# Patient Record
Sex: Female | Born: 1965 | Race: Black or African American | Hispanic: No | Marital: Married | Smoking: Never smoker
Health system: Southern US, Community
[De-identification: ages and names within clinical notes are randomized; demographics above are authoritative.]

## PROBLEM LIST (undated history)

## (undated) DIAGNOSIS — I10 Essential (primary) hypertension: Secondary | ICD-10-CM

## (undated) HISTORY — PX: HERNIA REPAIR: SHX51

## (undated) HISTORY — PX: ABDOMINAL HYSTERECTOMY: SHX81

---

## 2021-02-12 ENCOUNTER — Emergency Department (HOSPITAL_BASED_OUTPATIENT_CLINIC_OR_DEPARTMENT_OTHER): Payer: PRIVATE HEALTH INSURANCE

## 2021-02-12 ENCOUNTER — Encounter (HOSPITAL_BASED_OUTPATIENT_CLINIC_OR_DEPARTMENT_OTHER): Payer: Self-pay | Admitting: Emergency Medicine

## 2021-02-12 ENCOUNTER — Emergency Department (HOSPITAL_BASED_OUTPATIENT_CLINIC_OR_DEPARTMENT_OTHER)
Admission: EM | Admit: 2021-02-12 | Discharge: 2021-02-13 | Disposition: A | Discharge status: Home / Self Care | Payer: PRIVATE HEALTH INSURANCE | Attending: Emergency Medicine | Admitting: Emergency Medicine

## 2021-02-12 ENCOUNTER — Other Ambulatory Visit: Payer: Self-pay

## 2021-02-12 DIAGNOSIS — Z79899 Other long term (current) drug therapy: Secondary | ICD-10-CM | POA: Insufficient documentation

## 2021-02-12 DIAGNOSIS — I10 Essential (primary) hypertension: Secondary | ICD-10-CM | POA: Insufficient documentation

## 2021-02-12 DIAGNOSIS — R55 Syncope and collapse: Secondary | ICD-10-CM | POA: Insufficient documentation

## 2021-02-12 DIAGNOSIS — E041 Nontoxic single thyroid nodule: Secondary | ICD-10-CM

## 2021-02-12 DIAGNOSIS — I2699 Other pulmonary embolism without acute cor pulmonale: Secondary | ICD-10-CM | POA: Insufficient documentation

## 2021-02-12 DIAGNOSIS — E876 Hypokalemia: Secondary | ICD-10-CM

## 2021-02-12 HISTORY — DX: Essential (primary) hypertension: I10

## 2021-02-12 LAB — CBC WITH DIFFERENTIAL/PLATELET
Abs Immature Granulocytes: 0.03 10*3/uL (ref 0.00–0.07)
Basophils Absolute: 0 10*3/uL (ref 0.0–0.1)
Basophils Relative: 0 %
Eosinophils Absolute: 0.2 10*3/uL (ref 0.0–0.5)
Eosinophils Relative: 2 %
HCT: 36.4 % (ref 36.0–46.0)
Hemoglobin: 11.8 g/dL — ABNORMAL LOW (ref 12.0–15.0)
Immature Granulocytes: 0 %
Lymphocytes Relative: 30 %
Lymphs Abs: 2.7 10*3/uL (ref 0.7–4.0)
MCH: 26.2 pg (ref 26.0–34.0)
MCHC: 32.4 g/dL (ref 30.0–36.0)
MCV: 80.9 fL (ref 80.0–100.0)
Monocytes Absolute: 0.7 10*3/uL (ref 0.1–1.0)
Monocytes Relative: 8 %
Neutro Abs: 5.5 10*3/uL (ref 1.7–7.7)
Neutrophils Relative %: 60 %
Platelets: 340 10*3/uL (ref 150–400)
RBC: 4.5 MIL/uL (ref 3.87–5.11)
RDW: 12.7 % (ref 11.5–15.5)
WBC: 9.1 10*3/uL (ref 4.0–10.5)
nRBC: 0 % (ref 0.0–0.2)

## 2021-02-12 LAB — COMPREHENSIVE METABOLIC PANEL
ALT: 8 U/L (ref 0–44)
AST: 15 U/L (ref 15–41)
Albumin: 4.3 g/dL (ref 3.5–5.0)
Alkaline Phosphatase: 80 U/L (ref 38–126)
Anion gap: 12 (ref 5–15)
BUN: 13 mg/dL (ref 6–20)
CO2: 29 mmol/L (ref 22–32)
Calcium: 9.7 mg/dL (ref 8.9–10.3)
Chloride: 99 mmol/L (ref 98–111)
Creatinine, Ser: 0.9 mg/dL (ref 0.44–1.00)
GFR, Estimated: >60 mL/min (ref >60)
Glucose, Bld: 103 mg/dL — ABNORMAL HIGH (ref 70–99)
Potassium: 2.9 mmol/L — ABNORMAL LOW (ref 3.5–5.1)
Sodium: 140 mmol/L (ref 135–145)
Total Bilirubin: 0.3 mg/dL (ref 0.3–1.2)
Total Protein: 8 g/dL (ref 6.5–8.1)

## 2021-02-12 LAB — CBG MONITORING, ED: Glucose-Capillary: 97 mg/dL (ref 70–99)

## 2021-02-12 LAB — TROPONIN I (HIGH SENSITIVITY): Troponin I (High Sensitivity): 6 ng/L (ref <18)

## 2021-02-12 LAB — MAGNESIUM: Magnesium: 2.2 mg/dL (ref 1.7–2.4)

## 2021-02-12 MED ORDER — SODIUM CHLORIDE 0.9 % IV SOLN: INTRAVENOUS | Status: DC

## 2021-02-12 MED ORDER — SODIUM CHLORIDE 0.9 % IV BOLUS
1000.0000 mL | Freq: Once | INTRAVENOUS | Status: AC
  Administered 2021-02-12: 1000 mL via INTRAVENOUS

## 2021-02-12 MED ORDER — POTASSIUM CHLORIDE CRYS ER 20 MEQ PO TBCR
40.0000 meq | EXTENDED_RELEASE_TABLET | Freq: Once | ORAL | Status: AC
  Administered 2021-02-12: 40 meq via ORAL
  Filled 2021-02-12: qty 2

## 2021-02-12 MED ORDER — IOHEXOL 350 MG/ML SOLN
100.0000 mL | Freq: Once | INTRAVENOUS | Status: AC | PRN
  Administered 2021-02-12: 100 mL via INTRAVENOUS

## 2021-02-12 NOTE — ED Provider Notes (Incomplete)
[facility] EMERGENCY DEPARTMENT Provider Note   CSN: [id number] Arrival date & time: 02/12/21  2212     History Chief Complaint  Patient presents with  . Loss of Consciousness    Monica Rodgers is a 55 y.o. female.  HPI Patient is a 55 year old female presenting today after syncopal episode that occurred at approximately 6 AM this morning [address] time.  She lives in [place] and is visiting [place] to see her family.  Her husband is currently in the hospital in [place] for a stroke.  She states that she remembers feeling claustrophobic prior to the episode of syncope.  She denies any prodromal symptoms in the category of shortness of breath, chest pain, but did feel somewhat lightheaded.  She states that she also feels that she is somewhat dehydrated.  She states that over the past week or 2 she has felt more fatigued but has at no point felt short of breath or had any chest pain.  She states that she is relatively healthy has a history of hypertension and has been told her cholesterol is borderline.  She is not a smoker and uses no recreational drugs and rarely drinks alcohol.  She denies any recent hospitalization, immobilization, trauma. No recent surgeries, hospitalization, hemoptysis, estrogen containing OCP, cancer history.  No unilateral leg swelling.  No history of PE or VTE.     Past Medical History:  Diagnosis Date  . Hypertension     There are no problems to display for this patient.   Past Surgical History:  Procedure Laterality Date  . ABDOMINAL HYSTERECTOMY    . HERNIA REPAIR       OB History   No obstetric history on file.     No family history on file.  Social History   Tobacco Use  . Smoking status: Never Smoker  . Smokeless tobacco: Never Used  Substance Use Topics  . Alcohol use: Yes    Home Medications Prior to Admission medications   Medication Sig Start Date End Date Taking? Authorizing Provider  amLODipine  (NORVASC) 5 MG tablet  01/11/21   [provider]  buPROPion (WELLBUTRIN SR) 100 MG 12 hr tablet Take 100 mg by mouth every morning. 11/14/20   [provider]  finasteride (PROSCAR) 5 MG tablet Take by mouth. 01/11/21   [provider]  hydrochlorothiazide (HYDRODIURIL) 25 MG tablet Take 12.5 mg by mouth every morning. 11/14/20   [provider]  K-TAB 10 MEQ tablet Take 10 mEq by mouth daily. 11/14/20   [provider]  losartan (COZAAR) 100 MG tablet Take by mouth. 01/11/21   [provider]    Allergies    Patient has no known allergies.  Review of Systems   Review of Systems  Constitutional: Negative for chills and fever.  HENT: Negative for congestion.   Eyes: Negative for pain.  Respiratory: Negative for cough and shortness of breath.   Cardiovascular: Negative for chest pain and leg swelling.  Gastrointestinal: Negative for abdominal pain and vomiting.  Genitourinary: Negative for dysuria.  Musculoskeletal: Negative for myalgias.  Skin: Negative for rash.  Neurological: Positive for syncope. Negative for dizziness and headaches.    Physical Exam Updated Vital Signs BP (!) 146/108   Pulse (!) 104   Temp 98.2 F (36.8 C) (Oral)   Resp (!) 21   Ht 5\' 4"  (1.626 m)   Wt 78.5 kg   SpO2 100%   BMI 29.70 kg/m   Physical Exam Vitals  and nursing note reviewed.  Constitutional:      General: She is not in acute distress.    Appearance: She is obese.     Comments: Pleasant well-appearing 55 year old.  In no acute distress.  Sitting comfortably in bed.  Able answer questions appropriately follow commands. No increased work of breathing. Speaking in full sentences.  HENT:     Head: Normocephalic and atraumatic.     Nose: Nose normal.  Eyes:     General: No scleral icterus. Cardiovascular:     Rate and Rhythm: Normal rate and regular rhythm.     Pulses: Normal pulses.     Heart sounds: Normal heart sounds.  Pulmonary:      Effort: Pulmonary effort is normal. No respiratory distress.     Breath sounds: No wheezing.  Abdominal:     Palpations: Abdomen is soft.     Tenderness: There is no abdominal tenderness.  Musculoskeletal:     Cervical back: Normal range of motion.     Right lower leg: No edema.     Left lower leg: No edema.  Skin:    General: Skin is warm and dry.     Capillary Refill: Capillary refill takes less than 2 seconds.  Neurological:     Mental Status: She is alert. Mental status is at baseline.  Psychiatric:        Mood and Affect: Mood normal.        Behavior: Behavior normal.     ED Results / Procedures / Treatments   Labs (all labs ordered are listed, but only abnormal results are displayed) Labs Reviewed  CBC WITH DIFFERENTIAL/PLATELET - Abnormal; Notable for the following components:      Result Value   Hemoglobin 11.8 (*)    All other components within normal limits  COMPREHENSIVE METABOLIC PANEL - Abnormal; Notable for the following components:   Potassium 2.9 (*)    Glucose, Bld 103 (*)    All other components within normal limits  URINALYSIS, ROUTINE W REFLEX MICROSCOPIC  CBG MONITORING, ED  TROPONIN I (HIGH SENSITIVITY)    EKG EKG Interpretation  Date/Time:  Monday February 12 2021 22:24:33 EST Ventricular Rate:  100 PR Interval:    QRS Duration: 96 QT Interval:  354 QTC Calculation: 457 R Axis:   -4 Text Interpretation: Sinus tachycardia Borderline prolonged PR interval Abnormal R-wave progression, early transition No old tracing to compare Confirmed by Jacalyn Lefevre [phone]) on 02/12/2021 10:42:03 PM   Radiology No results found.  Procedures Procedures {Remember to document critical care time when appropriate:1}  Medications Ordered in ED Medications  sodium chloride 0.9 % bolus 1,000 mL (1,000 mLs Intravenous New Bag/Given 02/12/21 2309)    And  0.9 %  sodium chloride infusion (has no administration in time range)    ED Course  I have reviewed  the triage vital signs and the nursing notes.  Pertinent labs & imaging results that were available during my care of the patient were reviewed by me and considered in my medical decision making (see chart for details).  Patient here after episode of syncope while on an airplane today.  She had a long flight which included a 5-hour, 4-hour and 2-hour flight from [place].  She describes very atypical symptoms which include feeling claustrophobic and somewhat lightheaded and then passing out.  Per daughter who is with her she was unconscious for she was laid flat and then became conscious again.  EKG obtained which shows sinus tachycardia.  CBC  without leukocytosis or anemia.   Clinical Course as of 02/12/21 2335  Mon Feb 12, 2021  2331 Potassium(!): 2.9 [WF]    Clinical Course User Index [WF] Gailen Shelter, [place]   CT PE study negative for PE.  15 mm right thyroid lobe nodule. Recommend thyroid US (ref: J Am  Coll Radiol. 2015 Feb;12(2): 143-50).     MDM Rules/Calculators/A&P                          *** Final Clinical Impression(s) / ED Diagnoses Final diagnoses:  Syncope, unspecified syncope type    Rx / DC Orders ED Discharge Orders    None

## 2021-02-12 NOTE — ED Notes (Addendum)
Due to pt pulse being down in the 30s while pt was standing the 3 min wasn't done. RN Tresa Endo informed

## 2021-02-12 NOTE — ED Provider Notes (Signed)
[facility] EMERGENCY DEPARTMENT Provider Note   CSN: [id number] Arrival date & time: 02/12/21  2212     History Chief Complaint  Patient presents with  . Loss of Consciousness    Monica Rodgers is a 55 y.o. female.  HPI Patient is a 55 year old female presenting today after syncopal episode that occurred at approximately 6 AM this morning [address] time.  She lives in [place] and is visiting [place] to see her family.  Her husband is currently in the hospital in [place] for a stroke.  She states that she remembers feeling claustrophobic prior to the episode of syncope.  She denies any prodromal symptoms in the category of shortness of breath, chest pain, but did feel somewhat lightheaded.  She states that she also feels that she is somewhat dehydrated.  She states that over the past week or 2 she has felt more fatigued but has at no point felt short of breath or had any chest pain.  She states that she is relatively healthy has a history of hypertension and has been told her cholesterol is borderline.  She is not a smoker and uses no recreational drugs and rarely drinks alcohol.  She denies any recent hospitalization, immobilization, trauma. No recent surgeries, hospitalization, hemoptysis, estrogen containing OCP, cancer history.  No unilateral leg swelling.  No history of PE or VTE.     Past Medical History:  Diagnosis Date  . Hypertension     There are no problems to display for this patient.   Past Surgical History:  Procedure Laterality Date  . ABDOMINAL HYSTERECTOMY    . HERNIA REPAIR       OB History   No obstetric history on file.     No family history on file.  Social History   Tobacco Use  . Smoking status: Never Smoker  . Smokeless tobacco: Never Used  Substance Use Topics  . Alcohol use: Yes    Home Medications Prior to Admission medications   Medication Sig Start Date End Date Taking? Authorizing Provider  amLODipine  (NORVASC) 5 MG tablet  01/11/21   [provider]  buPROPion (WELLBUTRIN SR) 100 MG 12 hr tablet Take 100 mg by mouth every morning. 11/14/20   [provider]  finasteride (PROSCAR) 5 MG tablet Take by mouth. 01/11/21   [provider]  hydrochlorothiazide (HYDRODIURIL) 25 MG tablet Take 12.5 mg by mouth every morning. 11/14/20   [provider]  K-TAB 10 MEQ tablet Take 10 mEq by mouth daily. 11/14/20   [provider]  losartan (COZAAR) 100 MG tablet Take by mouth. 01/11/21   [provider]    Allergies    Patient has no known allergies.  Review of Systems   Review of Systems  Constitutional: Negative for chills and fever.  HENT: Negative for congestion.   Eyes: Negative for pain.  Respiratory: Negative for cough and shortness of breath.   Cardiovascular: Negative for chest pain and leg swelling.  Gastrointestinal: Negative for abdominal pain and vomiting.  Genitourinary: Negative for dysuria.  Musculoskeletal: Negative for myalgias.  Skin: Negative for rash.  Neurological: Positive for syncope. Negative for dizziness and headaches.    Physical Exam Updated Vital Signs BP (!) 146/108   Pulse (!) 104   Temp 98.2 F (36.8 C) (Oral)   Resp (!) 21   Ht 5\' 4"  (1.626 m)   Wt 78.5 kg   SpO2 100%   BMI 29.70 kg/m   Physical Exam Vitals  and nursing note reviewed.  Constitutional:      General: She is not in acute distress.    Appearance: She is obese.     Comments: Pleasant well-appearing 55 year old.  In no acute distress.  Sitting comfortably in bed.  Able answer questions appropriately follow commands. No increased work of breathing. Speaking in full sentences.  HENT:     Head: Normocephalic and atraumatic.     Nose: Nose normal.  Eyes:     General: No scleral icterus. Cardiovascular:     Rate and Rhythm: Normal rate and regular rhythm.     Pulses: Normal pulses.     Heart sounds: Normal heart sounds.  Pulmonary:      Effort: Pulmonary effort is normal. No respiratory distress.     Breath sounds: No wheezing.  Abdominal:     Palpations: Abdomen is soft.     Tenderness: There is no abdominal tenderness.  Musculoskeletal:     Cervical back: Normal range of motion.     Right lower leg: No edema.     Left lower leg: No edema.  Skin:    General: Skin is warm and dry.     Capillary Refill: Capillary refill takes less than 2 seconds.  Neurological:     Mental Status: She is alert. Mental status is at baseline.  Psychiatric:        Mood and Affect: Mood normal.        Behavior: Behavior normal.     ED Results / Procedures / Treatments   Labs (all labs ordered are listed, but only abnormal results are displayed) Labs Reviewed  CBC WITH DIFFERENTIAL/PLATELET - Abnormal; Notable for the following components:      Result Value   Hemoglobin 11.8 (*)    All other components within normal limits  COMPREHENSIVE METABOLIC PANEL - Abnormal; Notable for the following components:   Potassium 2.9 (*)    Glucose, Bld 103 (*)    All other components within normal limits  URINALYSIS, ROUTINE W REFLEX MICROSCOPIC  CBG MONITORING, ED  TROPONIN I (HIGH SENSITIVITY)    EKG EKG Interpretation  Date/Time:  Monday February 12 2021 22:24:33 EST Ventricular Rate:  100 PR Interval:    QRS Duration: 96 QT Interval:  354 QTC Calculation: 457 R Axis:   -4 Text Interpretation: Sinus tachycardia Borderline prolonged PR interval Abnormal R-wave progression, early transition No old tracing to compare Confirmed by Jacalyn Lefevre [phone]) on 02/12/2021 10:42:03 PM   Radiology No results found.  Procedures Procedures   Medications Ordered in ED Medications  sodium chloride 0.9 % bolus 1,000 mL (1,000 mLs Intravenous New Bag/Given 02/12/21 2309)    And  0.9 %  sodium chloride infusion (has no administration in time range)    ED Course  I have reviewed the triage vital signs and the nursing notes.  Pertinent  labs & imaging results that were available during my care of the patient were reviewed by me and considered in my medical decision making (see chart for details).  Patient here after episode of syncope while on an airplane today.  She had a long flight which included a 5-hour, 4-hour and 2-hour flight from [place].  She describes very atypical symptoms which include feeling claustrophobic and somewhat lightheaded and then passing out.  Per daughter who is with her she was unconscious for she was laid flat and then became conscious again.  EKG obtained which shows sinus tachycardia.  CBC without leukocytosis or anemia.  CMP unremarkable  apart from hypokalemia.  Troponin x1 within normal limits and magnesium within normal limits.  Patient did have an episode where her heart rate dropped to approximately 35.  I reviewed the telemetry strip and used calipers to evaluate this.  It was a brief period of time that this occurred and she had no symptoms from her bradycardia.  I evaluated patient at bedside and stood up again and her heart rate stayed between 70 and 90, there was one brief episode where her heart rate jumped to 104.  However this was brief and asymptomatic.  I discussed this case with my attending physician who cosigned this note including patient's presenting symptoms, physical exam, and planned diagnostics and interventions. Attending physician stated agreement with plan or made changes to plan which were implemented.   Attending physician assessed patient at bedside.  Clinical Course as of 02/12/21 2335  Mon Feb 12, 2021  2331 Potassium(!): 2.9 [WF]    Clinical Course User Index [WF] Gailen Shelter, [place]   CT PE study negative for PE.  15 mm right thyroid lobe nodule. Recommend thyroid US (ref: J Am  Coll Radiol. 2015 Feb;12(2): 143-50).   Patient educated on her mild hypokalemia was given repletion.  MDM Rules/Calculators/A&P                          Patient offered  admission however she would prefer to be discharged home at this time.  Given the episode of bradycardia will recommend that she have her atenolol dose and call her primary care doctor tomorrow for further recommendations.  Given strict return precautions.  She will be with her daughter all night.  All questions answered to best my ability.  Final Clinical Impression(s) / ED Diagnoses Final diagnoses:  Syncope, unspecified syncope type    Rx / DC Orders ED Discharge Orders    None       Gailen Shelter, [place] 02/13/21 0006    Paula Libra, MD 02/13/21 [id number]

## 2021-02-12 NOTE — ED Triage Notes (Signed)
Pt had a syncopal episode on an airplane at 0600 this morning. Daughter states pt was not responsive for 15 min.

## 2021-02-12 NOTE — Discharge Instructions (Addendum)
I recommend that you taper down (take half your prescription dose) of atenolol.  Please drink plenty of water and remain hydrated You may continue to take your other blood pressure medications.  Please call your primary care doctor in order to plan your next visit and for further direction on blood pressure management this time.    Your CT scan did show a small nodule on your thyroid which you will need to have rechecked by your primary care doctor. And they be beneficial to check thyroid levels at your next primary care doctor visit as well.  Your work-up today was reassuring.  You may always return to the ER for any new or concerning symptoms. Apart from mildly low potassium and an incidental finding of a thyroid nodule your work-up was without significant abnormality.  The short episode of your slow heart rate may be related or unrelated to your symptoms experienced earlier today.

## 2021-02-13 LAB — URINALYSIS, MICROSCOPIC (REFLEX)

## 2021-02-13 LAB — URINALYSIS, ROUTINE W REFLEX MICROSCOPIC
Bilirubin Urine: NEGATIVE
Glucose, UA: NEGATIVE mg/dL
Hgb urine dipstick: NEGATIVE
Ketones, ur: NEGATIVE mg/dL
Nitrite: NEGATIVE
Protein, ur: NEGATIVE mg/dL
Specific Gravity, Urine: 1.01 (ref 1.005–1.030)
pH: 7 (ref 5.0–8.0)

## 2021-11-30 IMAGING — CT CT ANGIO CHEST
2 of 8 series · 19 of 36 positions shown · IV contrast (Omnipaque)
Comparison: None.

CLINICAL DATA: Pulmonary embolism, syncope, recent air travel

EXAM:
CT ANGIOGRAPHY CHEST WITH CONTRAST
TECHNIQUE: Multidetector CT imaging of the chest was performed using the
standard protocol during bolus administration of intravenous
contrast. Multiplanar CT image reconstructions and MIPs were
obtained to evaluate the vascular anatomy.
CONTRAST:  100mL OMNIPAQUE IOHEXOL 350 MG/ML SOLN

[Series 6: pe thins · axial · 0.69mm/px · z∈[+815,+1082]mm · 18 of 299 slices shown]
[im 16/299  lung]
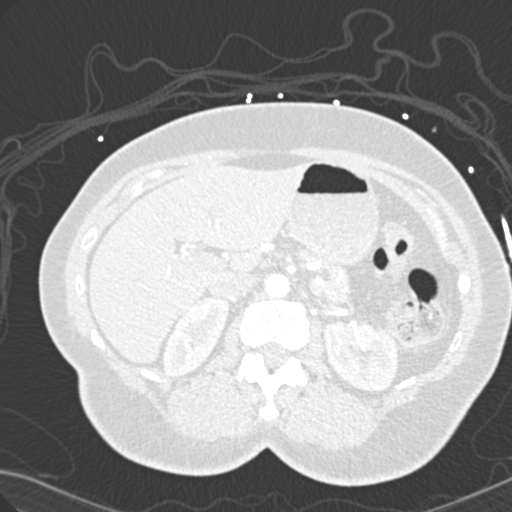
[im 32/299  mediastinal]
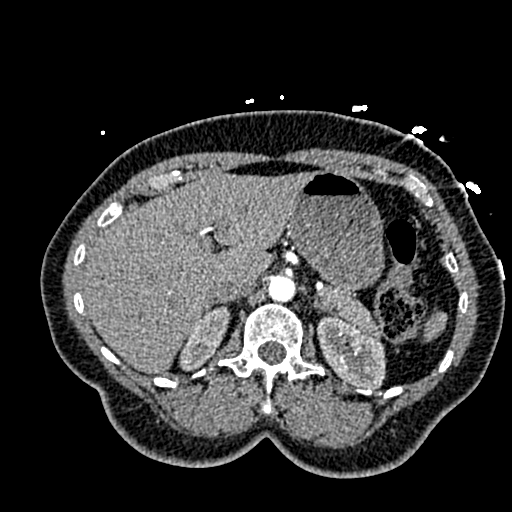
[im 48/299  lung]
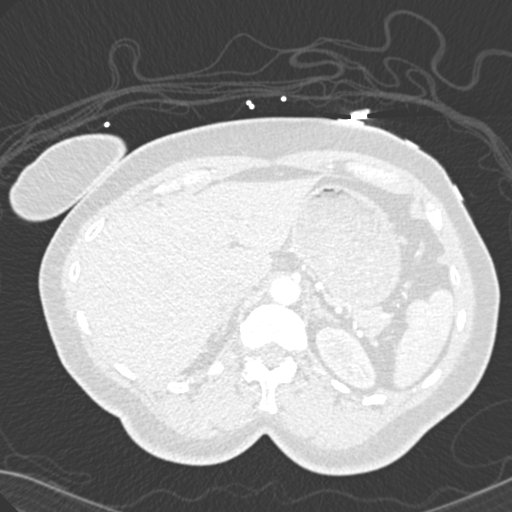
[im 63/299  mediastinal]
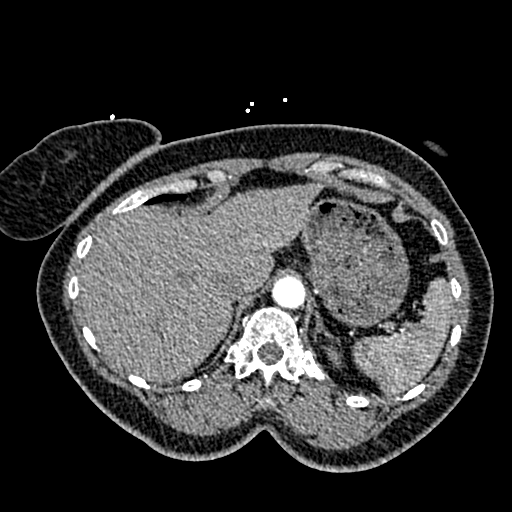
[im 79/299  lung]
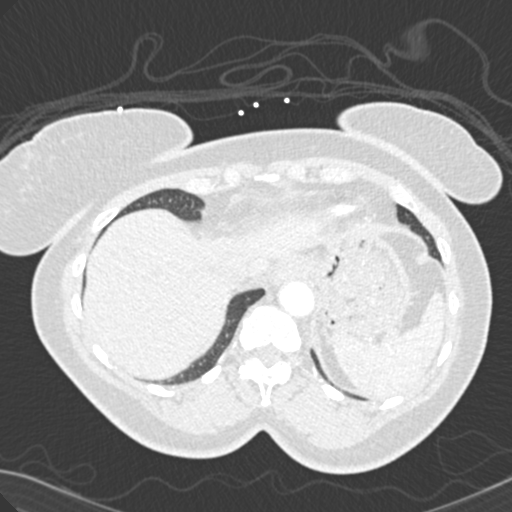
[im 95/299  mediastinal]
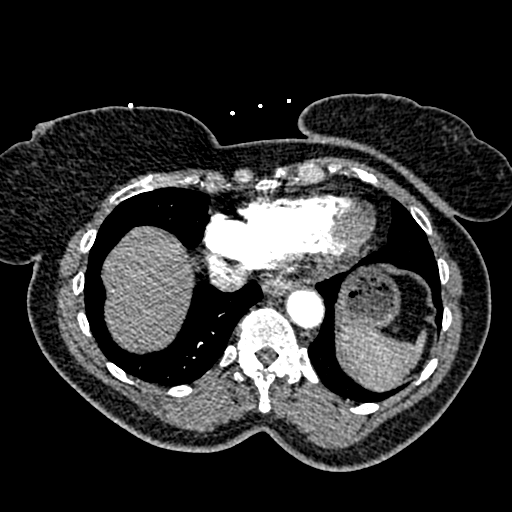
[im 110/299  lung]
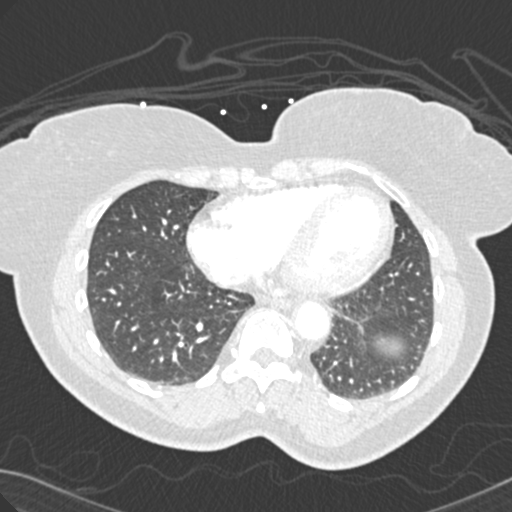
[im 126/299  mediastinal]
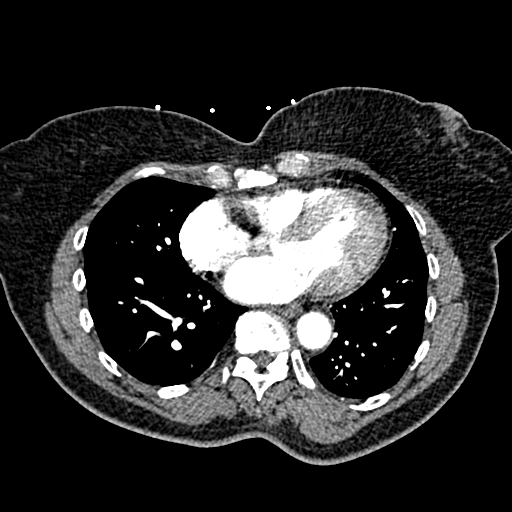
[im 142/299  lung]
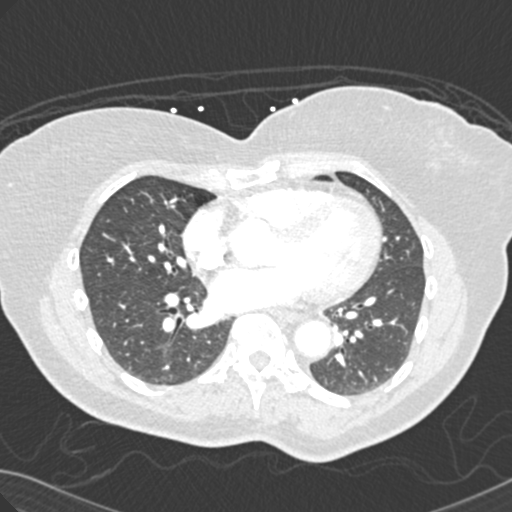
[im 157/299  mediastinal]
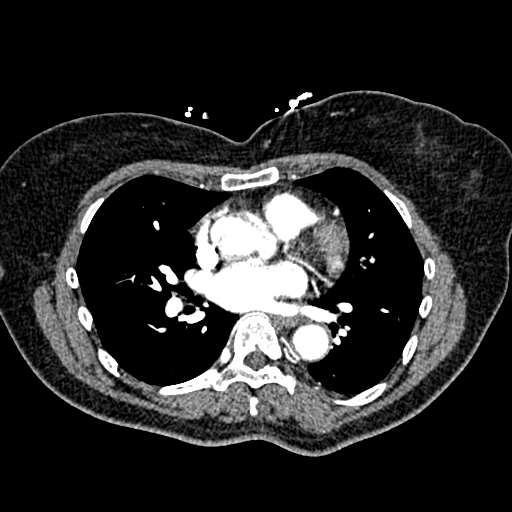
[im 173/299  lung]
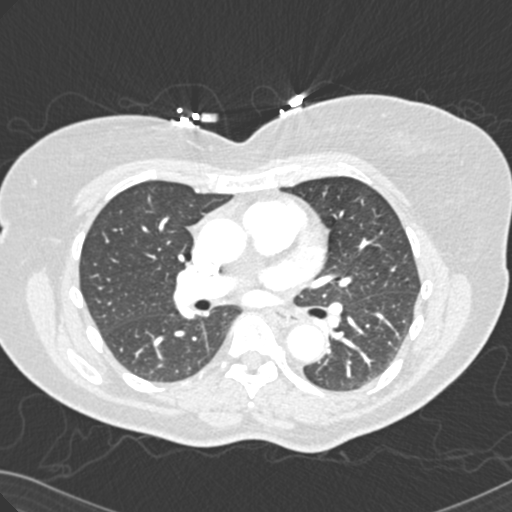
[im 189/299  mediastinal]
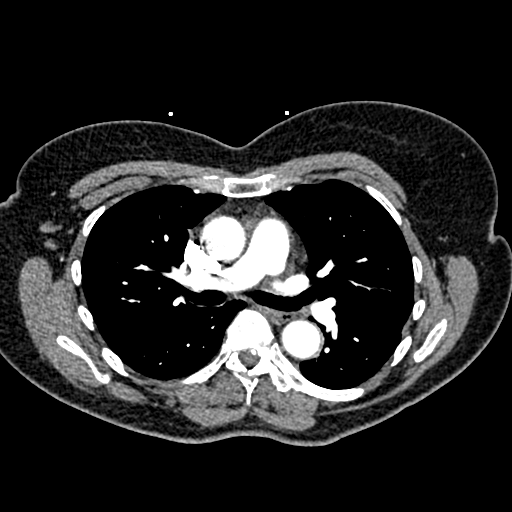
[im 204/299  lung]
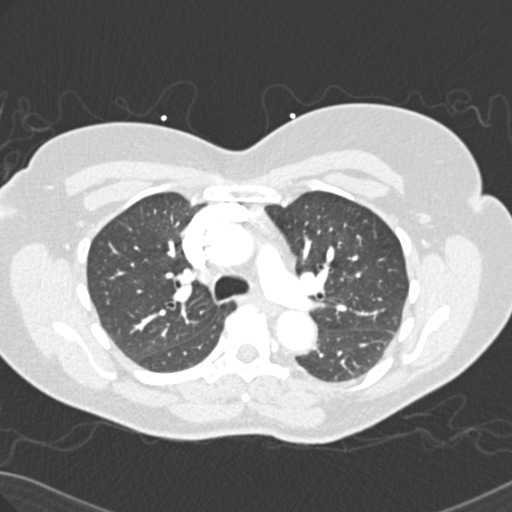
[im 220/299  mediastinal]
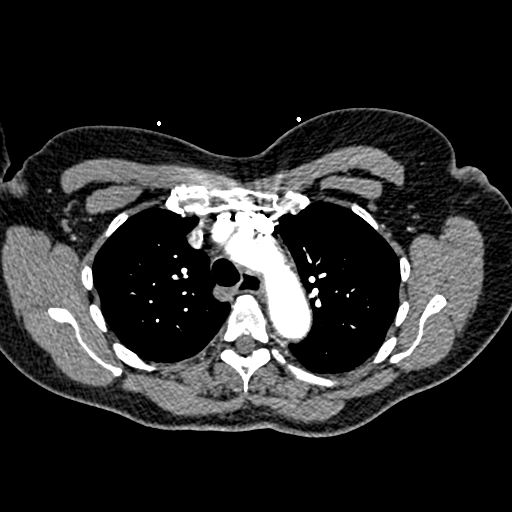
[im 236/299  lung]
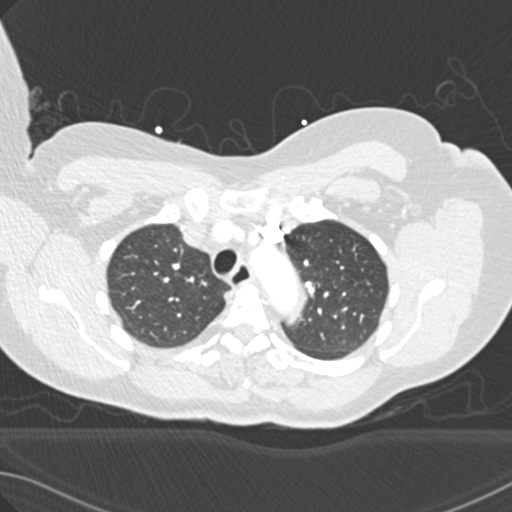
[im 251/299  mediastinal]
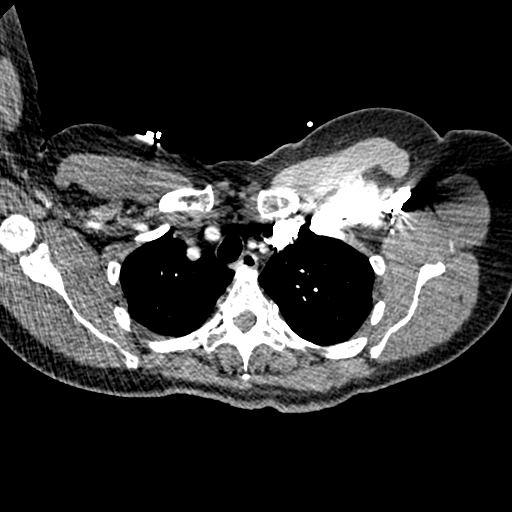
[im 267/299  lung]
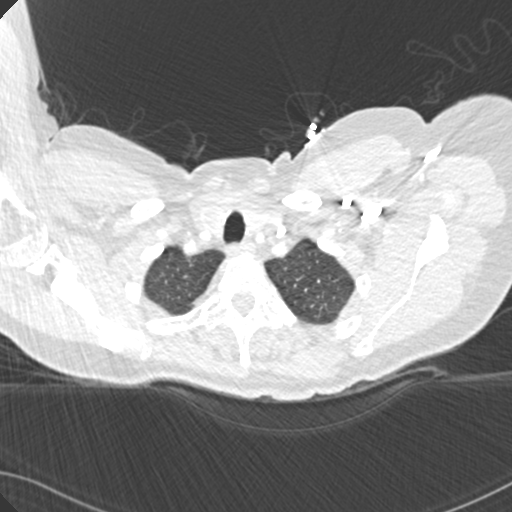
[im 283/299  mediastinal]
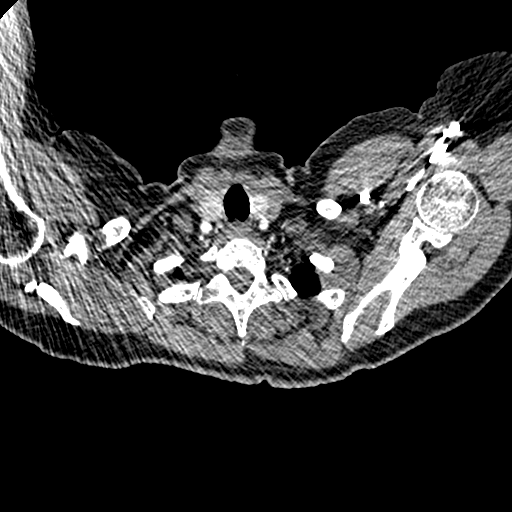

[Series 7: pe coronal mpr · coronal · 0.59mm/px · 1 of 106 slices shown]
[im 53/106  mediastinal]
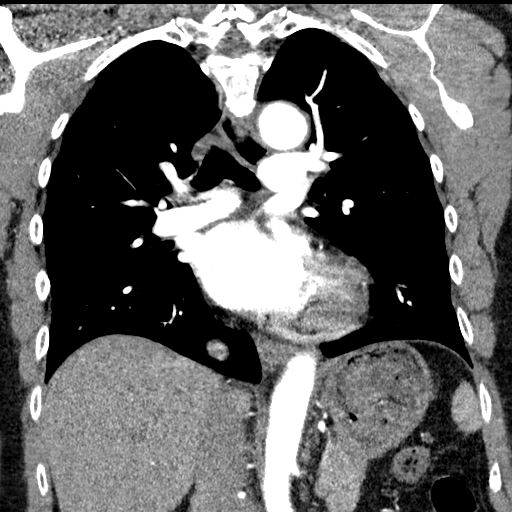

[19 of 36 positions shown; findings below may reference images not displayed]

FINDINGS: Cardiovascular: There is adequate opacification of the pulmonary
arterial tree. No intraluminal filling defect to suggest acute
pulmonary embolism. The central pulmonary arteries are of normal
caliber.

Global cardiac size is within normal limits. No significant coronary
artery calcification. No pericardial effusion. The thoracic aorta is
unremarkable.

Mediastinum/Nodes: 15 mm nodule within the right thyroid lobe, not
well characterized on this examination. No pathologic mediastinal
adenopathy. Esophagus unremarkable.

Lungs/Pleura: Lungs are clear. No pleural effusion or pneumothorax.
The central airways are widely patent.

Upper Abdomen: No acute abnormality.

Musculoskeletal: No acute bone abnormality. No lytic or blastic bone
lesions.

Review of the MIP images confirms the above findings.
IMPRESSION: No pulmonary embolism. No acute intrathoracic pathology. No definite
radiographic explanation for the patient's reported symptoms.

15 mm right thyroid lobe nodule. Recommend thyroid US (ref: [HOSPITAL]. [DATE]): 143-50).
